# Patient Record
Sex: Male | Born: 1990 | Race: White | Hispanic: No | Marital: Single | Smoking: Current every day smoker
Health system: Southern US, Community
[De-identification: ages and names within clinical notes are randomized; demographics above are authoritative.]

## PROBLEM LIST (undated history)

## (undated) DIAGNOSIS — J45909 Unspecified asthma, uncomplicated: Secondary | ICD-10-CM

## (undated) HISTORY — PX: WISDOM TOOTH EXTRACTION: SHX21

---
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
## 2013-06-25 IMAGING — CR DG THORACIC SPINE 2V
3 series · 3 of 3 positions shown · non-contrast
Comparison: None.

CLINICAL DATA: Neck and upper back pain.

THORACIC SPINE - 2 VIEW

[t t-spine a.p.]
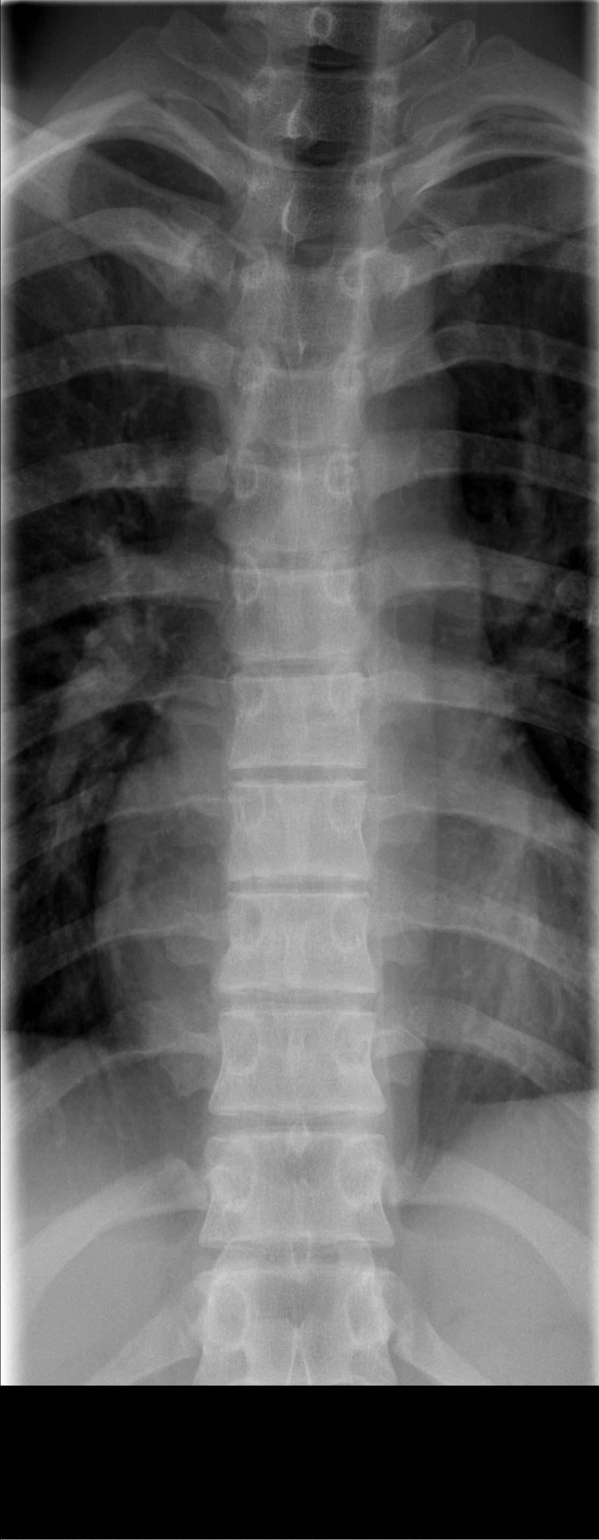

[t t-spine lat]
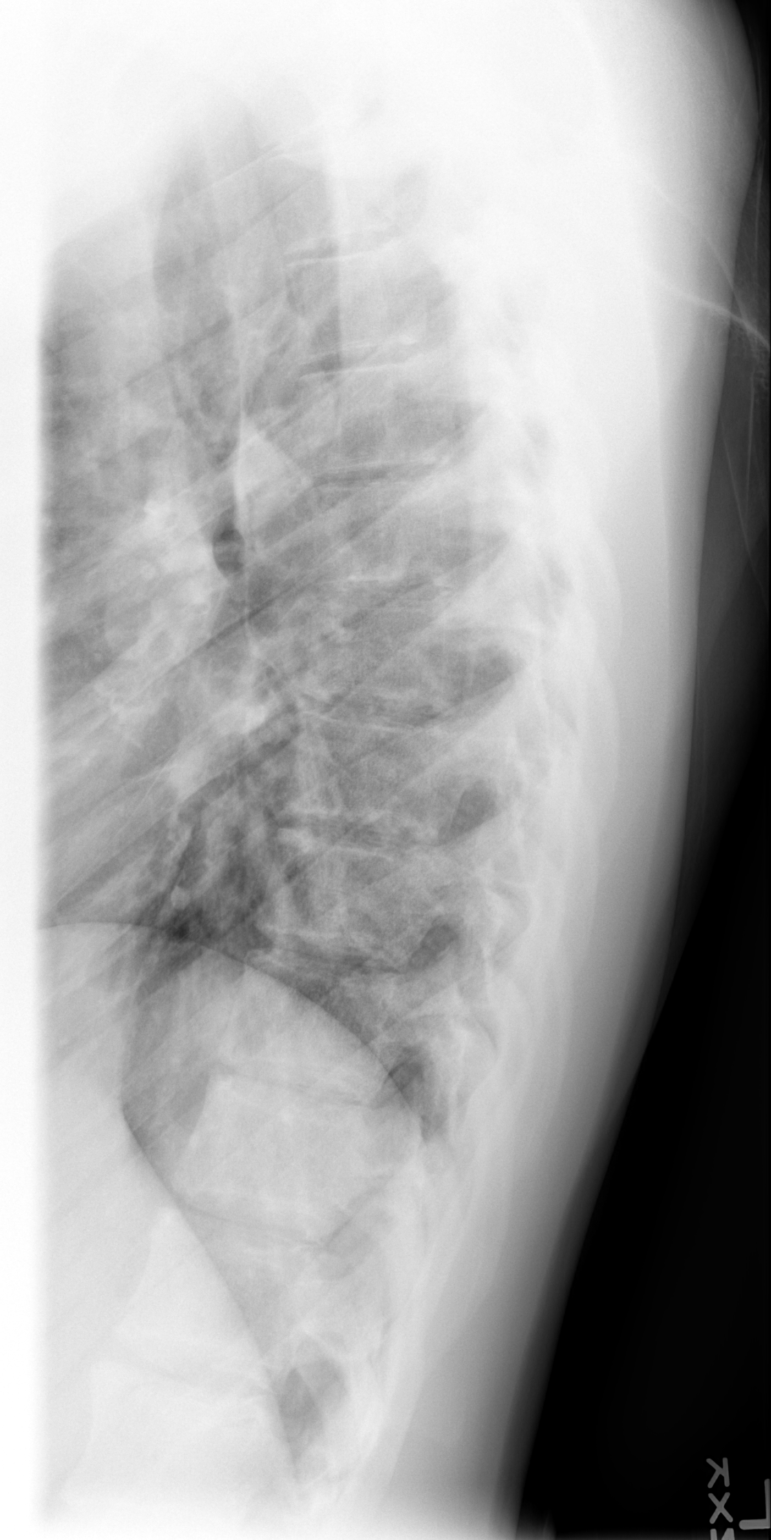

[t swimmers]
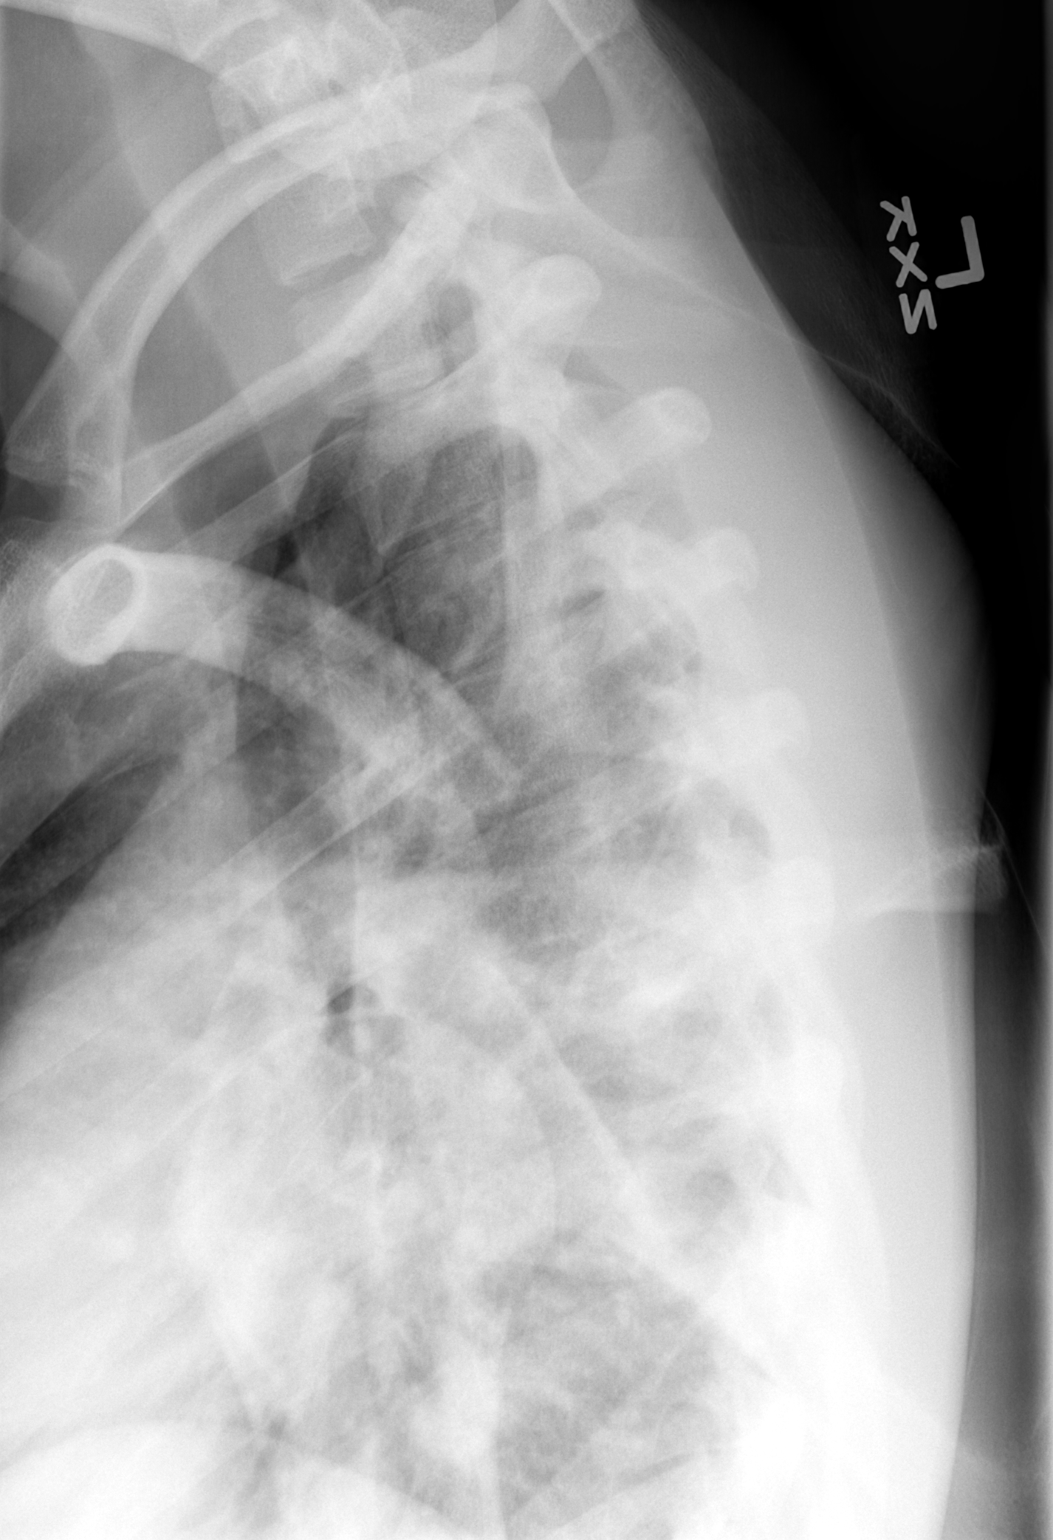

[3 of 3 positions shown; findings below may reference images not displayed]

FINDINGS: Vertebral body height and alignment are normal.
Paraspinous structures appear normal.
IMPRESSION: Normal study.

## 2018-06-04 ENCOUNTER — Other Ambulatory Visit: Payer: Self-pay

## 2018-06-04 ENCOUNTER — Emergency Department (HOSPITAL_COMMUNITY): Payer: BLUE CROSS/BLUE SHIELD

## 2018-06-04 ENCOUNTER — Encounter (HOSPITAL_COMMUNITY): Payer: Self-pay

## 2018-06-04 ENCOUNTER — Emergency Department (HOSPITAL_COMMUNITY)
Admission: EM | Admit: 2018-06-04 | Discharge: 2018-06-04 | Disposition: A | Discharge status: Home / Self Care | Payer: BLUE CROSS/BLUE SHIELD | Attending: Emergency Medicine | Admitting: Emergency Medicine

## 2018-06-04 DIAGNOSIS — Y929 Unspecified place or not applicable: Secondary | ICD-10-CM | POA: Diagnosis not present

## 2018-06-04 DIAGNOSIS — F17200 Nicotine dependence, unspecified, uncomplicated: Secondary | ICD-10-CM | POA: Insufficient documentation

## 2018-06-04 DIAGNOSIS — S61258A Open bite of other finger without damage to nail, initial encounter: Secondary | ICD-10-CM | POA: Diagnosis present

## 2018-06-04 DIAGNOSIS — Y999 Unspecified external cause status: Secondary | ICD-10-CM | POA: Diagnosis not present

## 2018-06-04 DIAGNOSIS — Z79899 Other long term (current) drug therapy: Secondary | ICD-10-CM | POA: Diagnosis not present

## 2018-06-04 DIAGNOSIS — Y93K9 Activity, other involving animal care: Secondary | ICD-10-CM | POA: Insufficient documentation

## 2018-06-04 DIAGNOSIS — W540XXA Bitten by dog, initial encounter: Secondary | ICD-10-CM | POA: Insufficient documentation

## 2018-06-04 DIAGNOSIS — J45909 Unspecified asthma, uncomplicated: Secondary | ICD-10-CM | POA: Diagnosis not present

## 2018-06-04 HISTORY — DX: Unspecified asthma, uncomplicated: J45.909

## 2018-06-04 MED ORDER — AMOXICILLIN-POT CLAVULANATE 875-125 MG PO TABS
1.0000 | ORAL_TABLET | Freq: Once | ORAL | Status: AC
  Administered 2018-06-04: 1 via ORAL
  Filled 2018-06-04: qty 1

## 2018-06-04 MED ORDER — LIDOCAINE HCL (PF) 1 % IJ SOLN
5.0000 mL | Freq: Once | INTRAMUSCULAR | Status: AC
  Administered 2018-06-04: 5 mL
  Filled 2018-06-04: qty 5

## 2018-06-04 MED ORDER — BUPIVACAINE HCL (PF) 0.25 % IJ SOLN
10.0000 mL | Freq: Once | INTRAMUSCULAR | Status: AC
  Administered 2018-06-04: 10 mL
  Filled 2018-06-04 (×2): qty 30

## 2018-06-04 MED ORDER — BUPIVACAINE HCL 0.25 % IJ SOLN: 5.0000 mL | Freq: Once | INTRAMUSCULAR | Status: DC

## 2018-06-04 MED ORDER — AMOXICILLIN-POT CLAVULANATE 875-125 MG PO TABS: 1.0000 | ORAL_TABLET | Freq: Two times a day (BID) | ORAL | 0 refills | Status: AC

## 2018-06-04 NOTE — ED Notes (Signed)
Wound has been debrided with sterile water and hydrogen peroxide. Lac tray at bedside.

## 2018-06-04 NOTE — ED Provider Notes (Signed)
[facility] EMERGENCY DEPARTMENT Provider Note   CSN: [id number] Arrival date & time: 06/04/18  1814     History   Chief Complaint Chief Complaint  Patient presents with  . Animal Bite    HPI Allen Lowe is a 27 y.o. male who presents to the ED with a dog bite to the right index finger. The injury occurred just prior to arrival to the ED. The dog belonged to a friend. Patient states the dog is up to date on immunizations and patient states he is up to date on tetanus.   The history is provided by the patient. No language interpreter was used.  Animal Bite  Contact animal:  Dog Location:  Finger Finger injury location:  R index finger Time since incident: just prior to arrival to the ED. Pain details:    Quality:  Sharp   Timing:  Constant Incident location:  Another residence Animal's rabies vaccination status:  Up to date Animal in possession: yes   Tetanus status:  Up to date Ineffective treatments:  None tried   Past Medical History:  Diagnosis Date  . Asthma     There are no active problems to display for this patient.   Past Surgical History:  Procedure Laterality Date  . WISDOM TOOTH EXTRACTION          Home Medications    Prior to Admission medications   Medication Sig Start Date End Date Taking? Authorizing Provider  albuterol (PROVENTIL HFA;VENTOLIN HFA) 108 (90 BASE) MCG/ACT inhaler Inhale 2 puffs into the lungs every 6 (six) hours as needed. For shortness of breath    [provider]  amoxicillin-clavulanate (AUGMENTIN) 875-125 MG tablet Take 1 tablet by mouth every 12 (twelve) hours. 06/04/18   Janne Napoleon, NP  cetirizine (ZYRTEC) 10 MG tablet Take 10 mg by mouth daily.    [provider]    Family History History reviewed. No pertinent family history.  Social History Social History   Tobacco Use  . Smoking status: Current Every Day Smoker    Packs/day: 0.50  . Smokeless tobacco: Never Used    Substance Use Topics  . Alcohol use: Yes    Comment: socially  . Drug use: Yes    Types: Marijuana     Allergies   Other   Review of Systems Review of Systems  Musculoskeletal: Positive for arthralgias.       Right index finger  Skin: Positive for wound.  All other systems reviewed and are negative.    Physical Exam Updated Vital Signs BP 127/76   Pulse 61   Temp 99.2 F (37.3 C) (Oral)   Resp 17   Ht 6\' 3"  (1.905 m)   Wt 75.8 kg   SpO2 98%   BMI 20.87 kg/m   Physical Exam Vitals signs and nursing note reviewed.  Constitutional:      General: He is not in acute distress.    Appearance: He is well-developed.  HENT:     Head: Normocephalic and atraumatic.  Eyes:     Extraocular Movements: Extraocular movements intact.  Neck:     Musculoskeletal: Neck supple.  Cardiovascular:     Rate and Rhythm: Normal rate.  Pulmonary:     Effort: Pulmonary effort is normal.  Musculoskeletal: Normal range of motion.     Right hand: He exhibits tenderness and laceration. He exhibits normal range of motion and normal capillary refill. Normal sensation noted. Decreased strength noted. He exhibits thumb/finger opposition.  Hands:     Comments: Puncture/laceration wounds to the right index finger.  Skin:    Comments: Dog bit to right index finger  Neurological:     Mental Status: He is alert and oriented to person, place, and time.  Psychiatric:        Mood and Affect: Mood normal.      ED Treatments / Results  Labs (all labs ordered are listed, but only abnormal results are displayed) Labs Reviewed - No data to display  Radiology Dg Finger Index Right  Result Date: 06/04/2018 CLINICAL DATA:  Recently bit by dog with laceration, initial encounter EXAM: RIGHT INDEX FINGER 2+V COMPARISON:  None. FINDINGS: Soft tissue defect is noted consistent with the recent injury. No definitive fracture or dislocation is seen. No foreign body is noted. IMPRESSION: Soft tissue  defect consistent with the injury. No bony abnormality is noted. Electronically Signed   By: Alcide CleverMark  Lukens M.D.   On: 06/04/2018 19:38    Procedures Irrigation Date/Time: 06/04/2018 8:47 PM Performed by: Janne NapoleonNeese, Braheem Tomasik M, NP Authorized by: Janne NapoleonNeese, Jackqulyn Mendel M, NP  Consent: Verbal consent obtained. Consent given by: patient Patient understanding: patient states understanding of the procedure being performed Imaging studies: imaging studies available Preparation: Patient was prepped and draped in the usual sterile fashion. Local anesthesia used: yes Anesthesia: digital block  Anesthesia: Local anesthesia used: yes  Sedation: Patient sedated: no  Patient tolerance: Patient tolerated the procedure well with no immediate complications Comments: Wounds cleaned with betadine scrub brush and irrigated with NSS after digital block.   Celedonio Miyamoto.Nerve Block Date/Time: 06/04/2018 8:48 PM Performed by: Janne NapoleonNeese, Rudene Poulsen M, NP Authorized by: Janne NapoleonNeese, Elzina Devera M, NP   Consent:    Consent obtained:  Verbal   Consent given by:  Patient   Risks discussed:  Swelling and unsuccessful block   Alternatives discussed:  Alternative treatment Indications:    Indications:  Pain relief and procedural anesthesia Location:    Nerve block body site: right index finger.   Laterality:  Right Pre-procedure details:    Preparation: Patient was prepped and draped in usual sterile fashion   Procedure details (see MAR for exact dosages):    Anesthetic injected:  Bupivacaine 0.25% w/o epi and lidocaine 1% w/o epi   Steroid injected:  None   Additive injected:  None   Injection procedure:  Anatomic landmarks identified, incremental injection, negative aspiration for blood and introduced needle Post-procedure details:    Outcome:  Anesthesia achieved   Patient tolerance of procedure:  Tolerated well, no immediate complications   (including critical care time)  Medications Ordered in ED Medications  amoxicillin-clavulanate  (AUGMENTIN) 875-125 MG per tablet 1 tablet (1 tablet Oral Given 06/04/18 1905)  lidocaine (PF) (XYLOCAINE) 1 % injection 5 mL (5 mLs Infiltration Given 06/04/18 2100)  bupivacaine (PF) (MARCAINE) 0.25 % injection 10 mL (10 mLs Infiltration Given 06/04/18 2100)     Initial Impression / Assessment and Plan / ED Course  I have reviewed the triage vital signs and the nursing notes. Patient presents with laceration from a dog bite.  Pt wounds irrigated well with 18ga angiocath with sterile saline.  Wounds examined with visualization of the base and no foreign bodies seen.  Pt Alert and oriented, NAD, nontoxic, nonseptic appearing.  Capillary refill intact and pt without neurologic deficit. x-rays without foreign body or injury to the bone. Patient tetanus updated.  Patient will not need rabies vaccine at this time since animal is up to date on rabies vaccine. Wounds  not closed secondary to concern for infection. We'll discharge home with pain medication, Augmentin and requests for close follow-up with PCP or back in the ER in 2 days for recheck.    Final Clinical Impressions(s) / ED Diagnoses   Final diagnoses:  Dog bite of index finger, initial encounter    ED Discharge Orders         Ordered    amoxicillin-clavulanate (AUGMENTIN) 875-125 MG tablet  Every 12 hours     06/04/18 2050           Kerrie Buffaloeese, Maxie Debose KadokaM, NP 06/04/18 2248    Doug SouJacubowitz, Sam, MD 06/06/18 [phone]

## 2018-06-04 NOTE — Discharge Instructions (Addendum)
The wound will need to be rechecked in 2 days. Return sooner for problems. Be sure not to miss a dose of the antibiotic.

## 2018-06-04 NOTE — ED Notes (Signed)
Pt. returned from XR. 

## 2018-06-04 NOTE — ED Triage Notes (Signed)
Pt arrives POV for eval of dog bites to blt hands/fingers. Pt was meeting friends dogs tonight and got in between the 2 of them while they were fighting. Pt has scattered puncture wounds to hands/fingers. Largest wound to R pointer finger w/ skin flap. Bleeding controlled. Pt is unsure of dogs vaccination status, but is working on finding out. Tetanus shot UTD.
